# Patient Record
Sex: Male | Born: 1965 | Race: White | Hispanic: No | Marital: Single | Smoking: Never smoker
Health system: Southern US, Community
[De-identification: ages and names within clinical notes are randomized; demographics above are authoritative.]

---

## 2010-04-27 ENCOUNTER — Emergency Department: Payer: Self-pay | Admitting: Emergency Medicine

## 2010-04-29 ENCOUNTER — Ambulatory Visit: Payer: Self-pay | Admitting: Rheumatology

## 2010-11-12 ENCOUNTER — Ambulatory Visit: Payer: Self-pay | Admitting: Rheumatology

## 2011-07-15 ENCOUNTER — Ambulatory Visit: Payer: Self-pay | Admitting: Gastroenterology

## 2012-01-18 ENCOUNTER — Ambulatory Visit: Payer: Self-pay | Admitting: Rheumatology

## 2012-01-31 ENCOUNTER — Emergency Department: Payer: Self-pay | Admitting: Emergency Medicine

## 2012-01-31 ENCOUNTER — Ambulatory Visit: Payer: Self-pay | Admitting: Internal Medicine

## 2012-01-31 LAB — BASIC METABOLIC PANEL
Anion Gap: 8 (ref 7–16)
Chloride: 105 mmol/L (ref 98–107)
Creatinine: 1.16 mg/dL (ref 0.60–1.30)
EGFR (Non-African Amer.): >60
Glucose: 161 mg/dL — ABNORMAL HIGH (ref 65–99)
Osmolality: 281 (ref 275–301)

## 2012-01-31 LAB — CBC
HCT: 46 % (ref 40.0–52.0)
HGB: 15.3 g/dL (ref 13.0–18.0)
RBC: 5.15 10*6/uL (ref 4.40–5.90)
RDW: 14 % (ref 11.5–14.5)
WBC: 9.2 10*3/uL (ref 3.8–10.6)

## 2012-01-31 LAB — URINALYSIS, COMPLETE
Leukocyte Esterase: NEGATIVE
Ph: 5 (ref 4.5–8.0)
Protein: 30
RBC,UR: 1138 /HPF (ref 0–5)
Squamous Epithelial: <1
WBC UR: 8 /HPF (ref 0–5)

## 2012-02-26 ENCOUNTER — Ambulatory Visit: Payer: Self-pay | Admitting: Urology

## 2012-03-23 ENCOUNTER — Ambulatory Visit: Payer: Self-pay | Admitting: Urology

## 2014-09-27 ENCOUNTER — Encounter: Payer: Self-pay | Admitting: Podiatry

## 2014-09-27 ENCOUNTER — Ambulatory Visit (INDEPENDENT_AMBULATORY_CARE_PROVIDER_SITE_OTHER): Payer: No Typology Code available for payment source

## 2014-09-27 ENCOUNTER — Ambulatory Visit (INDEPENDENT_AMBULATORY_CARE_PROVIDER_SITE_OTHER): Payer: No Typology Code available for payment source | Admitting: Podiatry

## 2014-09-27 VITALS — BP 135/82 | HR 90 | Resp 18

## 2014-09-27 DIAGNOSIS — M79609 Pain in unspecified limb: Secondary | ICD-10-CM | POA: Insufficient documentation

## 2014-09-27 DIAGNOSIS — M779 Enthesopathy, unspecified: Secondary | ICD-10-CM | POA: Diagnosis not present

## 2014-09-27 DIAGNOSIS — M79673 Pain in unspecified foot: Secondary | ICD-10-CM

## 2014-09-27 NOTE — Progress Notes (Signed)
Subjective:     Patient ID: Joel Fletcher, male   DOB: March 26, 1965, 49 y.o.   MRN: [id number]  HPI 49 year old male presents to the office today for concerns of tendonitis which has been ongoing for greater than 1 year. He was seeing another podiatrist who treatment him for quite some time and continued to have a lot of foot pain. He was then referred to rheumatology.  He followed up with Dr. Gavin Potters and was diagnosed with psoriatic arthritis. He was on Embrel which helped then changed to [facility] then now on Remicade. Since starting the medication he can walk much better but he states he continues to get pain in his tendons. He went to see Dr. Orland Jarred to get orthotics and was diagnosed with tendonitis.  He has not yet picked up the orthotics.He has tried new shoes and OTC inserts which helps some. 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   Review of Systems  All other systems reviewed and are negative.      Objective:   Physical Exam AAO x3, NAD DP/PT pulses palpable bilaterally, CRT less than 3 seconds Protective sensation intact with Simms Weinstein monofilament, vibratory sensation intact, Achilles tendon reflex intact  There is tenderness palpation  On the proximal portion of the Achilles tendon bilaterally. There is noted palpable defect and Thompson test is negative.  There is no tenderness on the distal portion of the tendon on the insertion in the calcaneus. There is mild equinus present.There is no other areas of tenderness to bilateral lower extremities. No areas of tenderness to bilateral lower extremities. MMT 5/5, ROM WNL.  No open lesions or pre-ulcerative lesions.  No overlying edema, erythema, increase in warmth to bilateral lower extremities.  No pain with calf compression, swelling, warmth, erythema bilaterally.      Assessment:     49 year old male with bilateral Achilles tendinitis     Plan:     -X-rays were obtained and reviewed with the patient.  -Treatment options discussed including all alternatives, risks, and complications -  I discussed likely etiology of his symptoms. I discussed with him that is likely a combination of the psoriatic arthritis causing a tendinitis. I do that he'll likely benefit from physical therapy to help stretch and strengthen the Achilles tendon and his leg muscles however he states that he cannot afford to go. I discussed with him home exercises and rehabilitation exercises to start. He states that he also purchased a night splint (he would like to get one OTC instead of [place] providing one due to cost).  Discussed stretching and icing activities daily. Also discussed and continue supportive shoe gear. He may benefit from a custom orthotics which were previously made. He did so go pick those up. -Follow-up 4 weeks or sooner if any problems arise. In the meantime, encouraged to call the office with any questions, concerns, change in symptoms.   Ovid Curd, DPM

## 2014-09-27 NOTE — Patient Instructions (Signed)
Achilles Tendinitis   with Rehab  Achilles tendinitis is a disorder of the Achilles tendon. The Achilles tendon connects the large calf muscles (Gastrocnemius and Soleus) to the heel bone (calcaneus). This tendon is sometimes called the heel cord. It is important for pushing-off and standing on your toes and is important for walking, running, or jumping. Tendinitis is often caused by overuse and repetitive microtrauma.  SYMPTOMS  · Pain, tenderness, swelling, warmth, and redness may occur over the Achilles tendon even at rest.  · Pain with pushing off, or flexing or extending the ankle.  · Pain that is worsened after or during activity.  CAUSES   · Overuse sometimes seen with rapid increase in exercise programs or in sports requiring running and jumping.  · Poor physical conditioning (strength and flexibility or endurance).  · Running sports, especially training running down hills.  · Inadequate warm-up before practice or play or failure to stretch before participation.  · Injury to the tendon.  PREVENTION   · Warm up and stretch before practice or competition.  · Allow time for adequate rest and recovery between practices and competition.  · Keep up conditioning.  ¨ Keep up ankle and leg flexibility.  ¨ Improve or keep muscle strength and endurance.  ¨ Improve cardiovascular fitness.  · Use proper technique.  · Use proper equipment (shoes, skates).  · To help prevent recurrence, taping, protective strapping, or an adhesive bandage may be recommended for several weeks after healing is complete.  PROGNOSIS   · Recovery may take weeks to several months to heal.  · Longer recovery is expected if symptoms have been prolonged.  · Recovery is usually quicker if the inflammation is due to a direct blow as compared with overuse or sudden strain.  RELATED COMPLICATIONS   · Healing time will be prolonged if the condition is not correctly treated. The injury must be given plenty of time to heal.  · Symptoms can reoccur if  activity is resumed too soon.  · Untreated, tendinitis may increase the risk of tendon rupture requiring additional time for recovery and possibly surgery.  TREATMENT   · The first treatment consists of rest anti-inflammatory medication, and ice to relieve the pain.  · Stretching and strengthening exercises after resolution of pain will likely help reduce the risk of recurrence. Referral to a physical therapist or athletic trainer for further evaluation and treatment may be helpful.  · A walking boot or cast may be recommended to rest the Achilles tendon. This can help break the cycle of inflammation and microtrauma.  · Arch supports (orthotics) may be prescribed or recommended by your caregiver as an adjunct to therapy and rest.  · Surgery to remove the inflamed tendon lining or degenerated tendon tissue is rarely necessary and has shown less than predictable results.  MEDICATION   · Nonsteroidal anti-inflammatory medications, such as aspirin and ibuprofen, may be used for pain and inflammation relief. Do not take within 7 days before surgery. Take these as directed by your caregiver. Contact your caregiver immediately if any bleeding, stomach upset, or signs of allergic reaction occur. Other minor pain relievers, such as acetaminophen, may also be used.  · Pain relievers may be prescribed as necessary by your caregiver. Do not take prescription pain medication for longer than 4 to 7 days. Use only as directed and only as much as you need.  · Cortisone injections are rarely indicated. Cortisone injections may weaken tendons and predispose to rupture. It is better   to give the condition more time to heal than to use them.  HEAT AND COLD  · Cold is used to relieve pain and reduce inflammation for acute and chronic Achilles tendinitis. Cold should be applied for 10 to 15 minutes every 2 to 3 hours for inflammation and pain and immediately after any activity that aggravates your symptoms. Use ice packs or an ice  massage.  · Heat may be used before performing stretching and strengthening activities prescribed by your caregiver. Use a heat pack or a warm soak.  SEEK MEDICAL CARE IF:  · Symptoms get worse or do not improve in 2 weeks despite treatment.  · New, unexplained symptoms develop. Drugs used in treatment may produce side effects.  EXERCISES  RANGE OF MOTION (ROM) AND STRETCHING EXERCISES - Achilles Tendinitis   These exercises may help you when beginning to rehabilitate your injury. Your symptoms may resolve with or without further involvement from your physician, physical therapist or athletic trainer. While completing these exercises, remember:   · Restoring tissue flexibility helps normal motion to return to the joints. This allows healthier, less painful movement and activity.  · An effective stretch should be held for at least 30 seconds.  · A stretch should never be painful. You should only feel a gentle lengthening or release in the stretched tissue.  STRETCH - Gastroc, Standing   · Place hands on wall.  · Extend right / left leg, keeping the front knee somewhat bent.  · Slightly point your toes inward on your back foot.  · Keeping your right / left heel on the floor and your knee straight, shift your weight toward the wall, not allowing your back to arch.  · You should feel a gentle stretch in the right / left calf. Hold this position for __________ seconds.  Repeat __________ times. Complete this stretch __________ times per day.  STRETCH - Soleus, Standing   · Place hands on wall.  · Extend right / left leg, keeping the other knee somewhat bent.  · Slightly point your toes inward on your back foot.  · Keep your right / left heel on the floor, bend your back knee, and slightly shift your weight over the back leg so that you feel a gentle stretch deep in your back calf.  · Hold this position for __________ seconds.  Repeat __________ times. Complete this stretch __________ times per day.  STRETCH -  Gastrocsoleus, Standing   Note: This exercise can place a lot of stress on your foot and ankle. Please complete this exercise only if specifically instructed by your caregiver.   · Place the ball of your right / left foot on a step, keeping your other foot firmly on the same step.  · Hold on to the wall or a rail for balance.  · Slowly lift your other foot, allowing your body weight to press your heel down over the edge of the step.  · You should feel a stretch in your right / left calf.  · Hold this position for __________ seconds.  · Repeat this exercise with a slight bend in your knee.  Repeat __________ times. Complete this stretch __________ times per day.   STRENGTHENING EXERCISES - Achilles Tendinitis  These exercises may help you when beginning to rehabilitate your injury. They may resolve your symptoms with or without further involvement from your physician, physical therapist or athletic trainer. While completing these exercises, remember:   · Muscles can gain both the endurance   and the strength needed for everyday activities through controlled exercises.  · Complete these exercises as instructed by your physician, physical therapist or athletic trainer. Progress the resistance and repetitions only as guided.  · You may experience muscle soreness or fatigue, but the pain or discomfort you are trying to eliminate should never worsen during these exercises. If this pain does worsen, stop and make certain you are following the directions exactly. If the pain is still present after adjustments, discontinue the exercise until you can discuss the trouble with your clinician.  STRENGTH - Plantar-flexors   · Sit with your right / left leg extended. Holding onto both ends of a rubber exercise band/tubing, loop it around the ball of your foot. Keep a slight tension in the band.  · Slowly push your toes away from you, pointing them downward.  · Hold this position for __________ seconds. Return slowly, controlling the  tension in the band/tubing.  Repeat __________ times. Complete this exercise __________ times per day.   STRENGTH - Plantar-flexors   · Stand with your feet shoulder width apart. Steady yourself with a wall or table using as little support as needed.  · Keeping your weight evenly spread over the width of your feet, rise up on your toes.*  · Hold this position for __________ seconds.  Repeat __________ times. Complete this exercise __________ times per day.   *If this is too easy, shift your weight toward your right / left leg until you feel challenged. Ultimately, you may be asked to do this exercise with your right / left foot only.  STRENGTH - Plantar-flexors, Eccentric   Note: This exercise can place a lot of stress on your foot and ankle. Please complete this exercise only if specifically instructed by your caregiver.   · Place the balls of your feet on a step. With your hands, use only enough support from a wall or rail to keep your balance.  · Keep your knees straight and rise up on your toes.  · Slowly shift your weight entirely to your right / left toes and pick up your opposite foot. Gently and with controlled movement, lower your weight through your right / left foot so that your heel drops below the level of the step. You will feel a slight stretch in the back of your calf at the end position.  · Use the healthy leg to help rise up onto the balls of both feet, then lower weight only on the right / left leg again. Build up to 15 repetitions. Then progress to 3 consecutive sets of 15 repetitions.*  · After completing the above exercise, complete the same exercise with a slight knee bend (about 30 degrees). Again, build up to 15 repetitions. Then progress to 3 consecutive sets of 15 repetitions.*  Perform this exercise __________ times per day.   *When you easily complete 3 sets of 15, your physician, physical therapist or athletic trainer may advise you to add resistance by wearing a backpack filled with  additional weight.  STRENGTH - Plantar Flexors, Seated   · Sit on a chair that allows your feet to rest flat on the ground. If necessary, sit at the edge of the chair.  · Keeping your toes firmly on the ground, lift your right / left heel as far as you can without increasing any discomfort in your ankle.  Repeat __________ times. Complete this exercise __________ times a day.  *If instructed by your physician, physical therapist or athletic   trainer, you may add ____________________ of resistance by placing a weighted object on your right / left knee.  Document Released: 08/20/2004 Document Revised: 04/13/2011 Document Reviewed: 05/03/2008  [employer]® Patient Information ©2015 [employer]. This information is not intended to replace advice given to you by your health care provider. Make sure you discuss any questions you have with your health care provider.

## 2014-10-24 ENCOUNTER — Other Ambulatory Visit: Payer: Self-pay | Admitting: Gastroenterology

## 2014-10-24 DIAGNOSIS — R7989 Other specified abnormal findings of blood chemistry: Secondary | ICD-10-CM

## 2014-10-24 DIAGNOSIS — R945 Abnormal results of liver function studies: Secondary | ICD-10-CM

## 2014-10-25 ENCOUNTER — Ambulatory Visit: Payer: No Typology Code available for payment source | Admitting: Podiatry

## 2014-10-29 ENCOUNTER — Ambulatory Visit: Payer: Self-pay

## 2014-11-06 ENCOUNTER — Ambulatory Visit
Admission: RE | Admit: 2014-11-06 | Discharge: 2014-11-06 | Disposition: A | Discharge status: Home / Self Care | Payer: No Typology Code available for payment source | Source: Ambulatory Visit | Attending: Gastroenterology | Admitting: Gastroenterology

## 2014-11-06 DIAGNOSIS — Z87442 Personal history of urinary calculi: Secondary | ICD-10-CM | POA: Diagnosis not present

## 2014-11-06 DIAGNOSIS — N200 Calculus of kidney: Secondary | ICD-10-CM | POA: Diagnosis not present

## 2014-11-06 DIAGNOSIS — R7989 Other specified abnormal findings of blood chemistry: Secondary | ICD-10-CM | POA: Diagnosis present

## 2014-11-06 DIAGNOSIS — R945 Abnormal results of liver function studies: Secondary | ICD-10-CM

## 2015-07-08 ENCOUNTER — Other Ambulatory Visit: Payer: Self-pay | Admitting: Obstetrics and Gynecology

## 2015-07-08 ENCOUNTER — Ambulatory Visit
Admission: RE | Admit: 2015-07-08 | Discharge: 2015-07-08 | Disposition: A | Discharge status: Home / Self Care | Payer: Disability Insurance | Source: Ambulatory Visit | Attending: Obstetrics and Gynecology | Admitting: Obstetrics and Gynecology

## 2015-07-08 DIAGNOSIS — L4050 Arthropathic psoriasis, unspecified: Secondary | ICD-10-CM

## 2015-07-08 DIAGNOSIS — M4602 Spinal enthesopathy, cervical region: Secondary | ICD-10-CM | POA: Insufficient documentation

## 2015-07-08 DIAGNOSIS — M2578 Osteophyte, vertebrae: Secondary | ICD-10-CM | POA: Diagnosis not present

## 2017-05-09 IMAGING — US US ABDOMEN COMPLETE
1 series · 13 of 25 positions shown · non-contrast
Comparison: Abdominal CT scan March 23, 2012 and abdominal
ultrasound July 15, 2011.

CLINICAL DATA: Abnormal liver function studies.

EXAM:
ULTRASOUND ABDOMEN COMPLETE

[Series 1: us abdomen complete · 0.22mm/px · 13 of 84 slices shown]
[im 1/84]
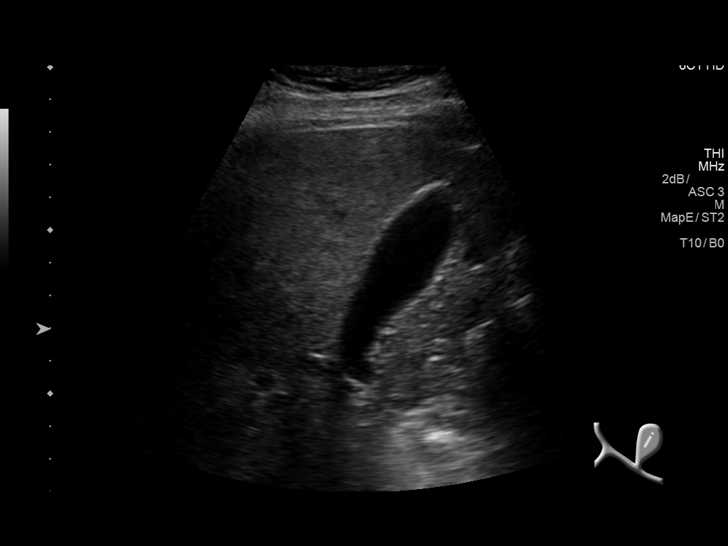
[im 7/84]
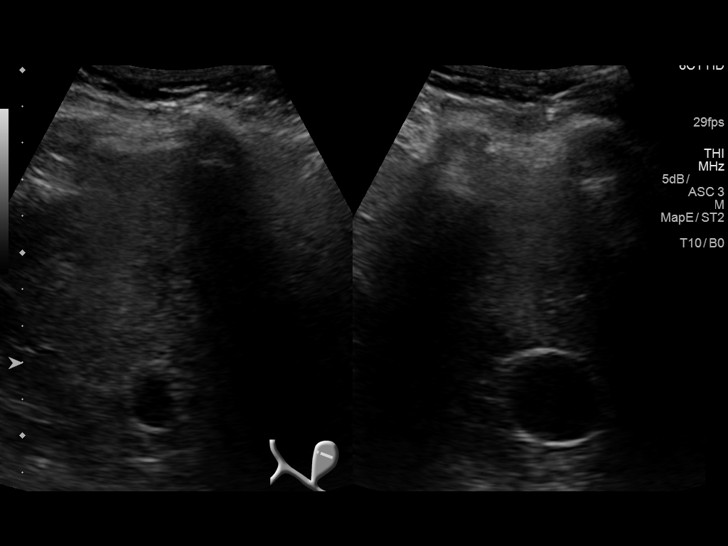
[im 14/84]
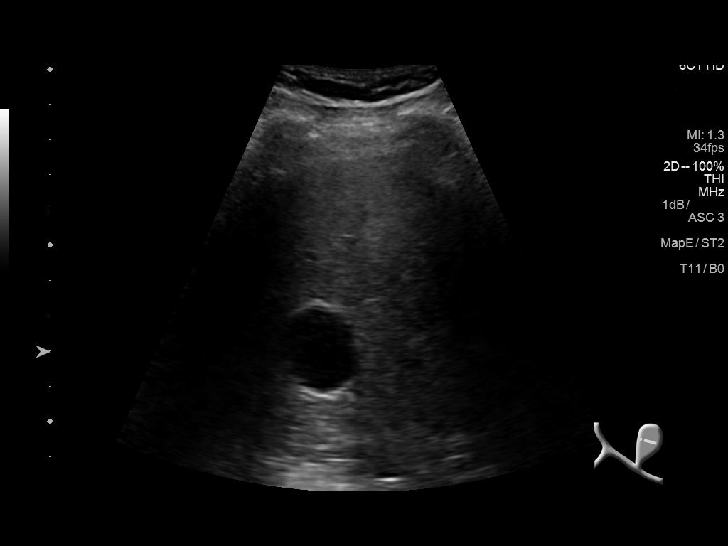
[im 21/84]
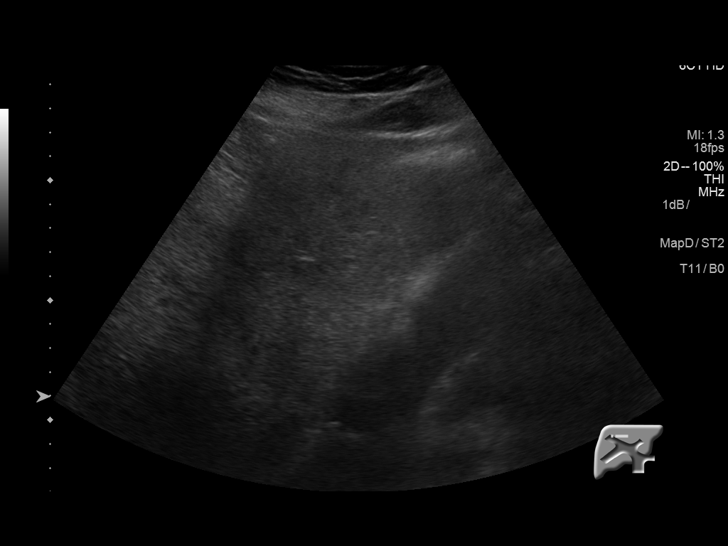
[im 28/84]
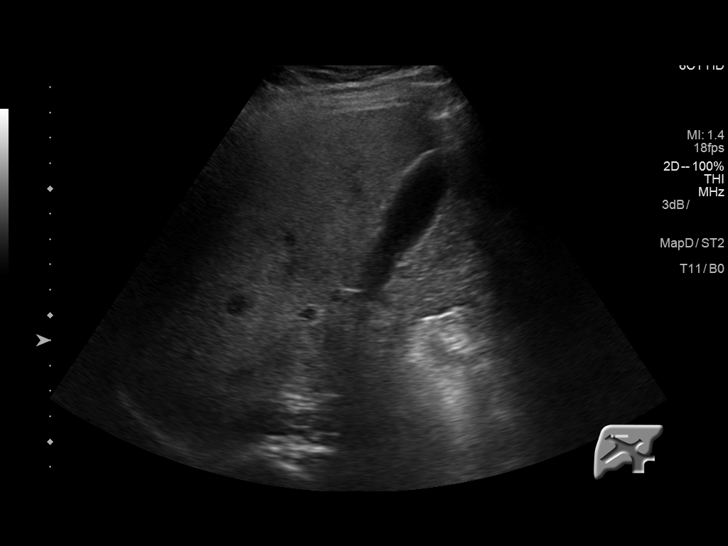
[im 35/84]
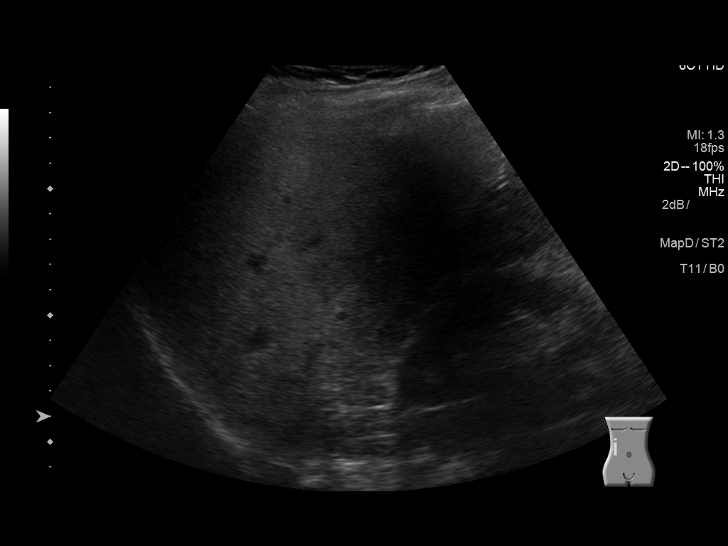
[im 42/84]
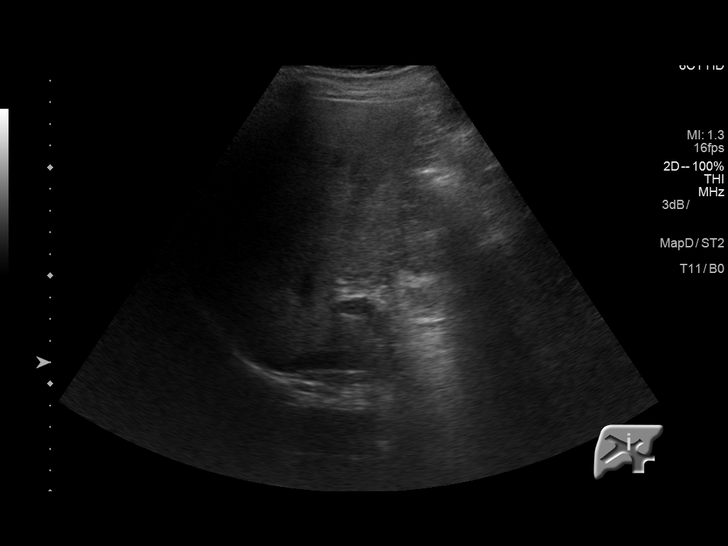
[im 49/84]
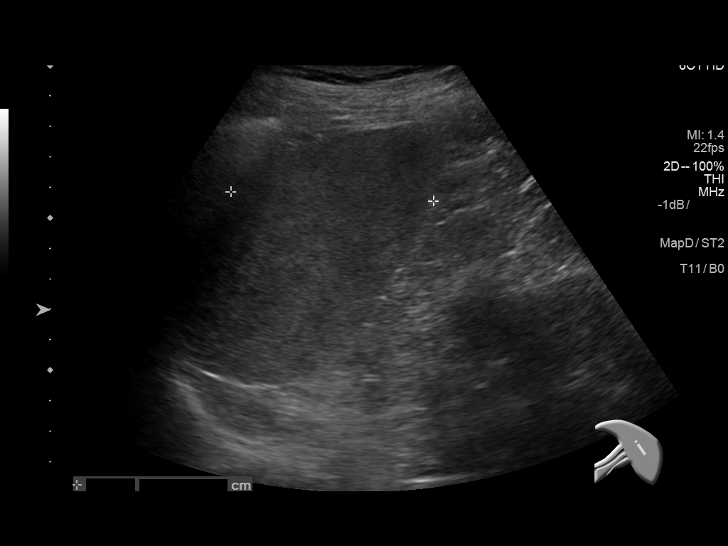
[im 56/84]
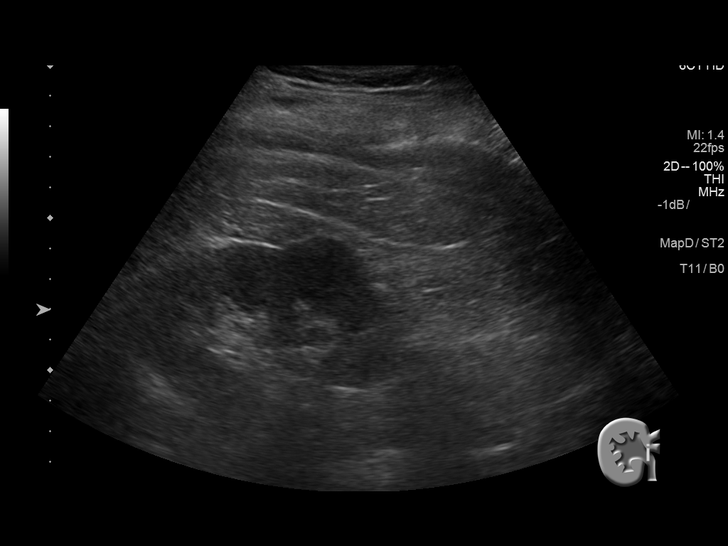
[im 63/84]
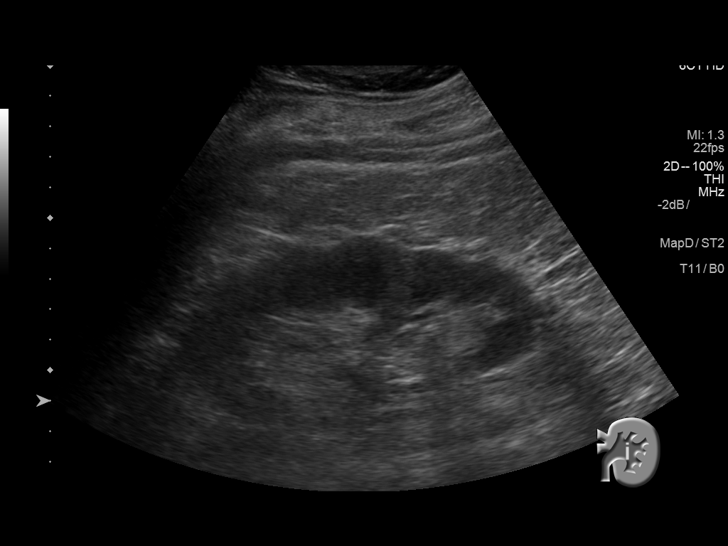
[im 70/84]
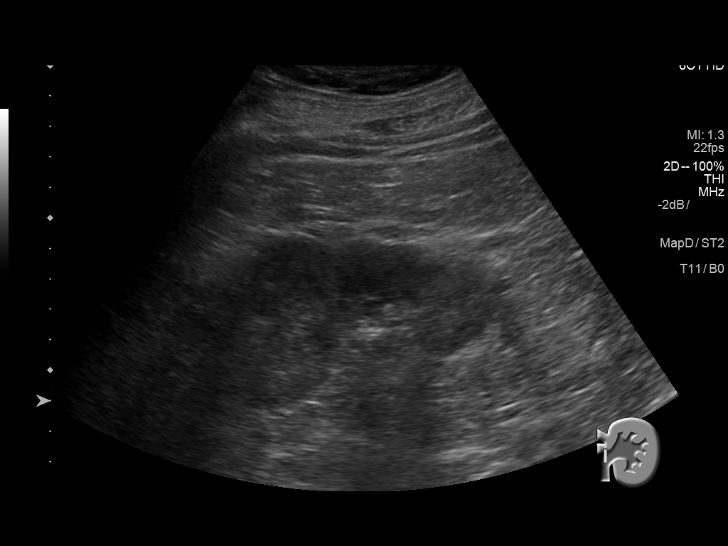
[im 77/84]
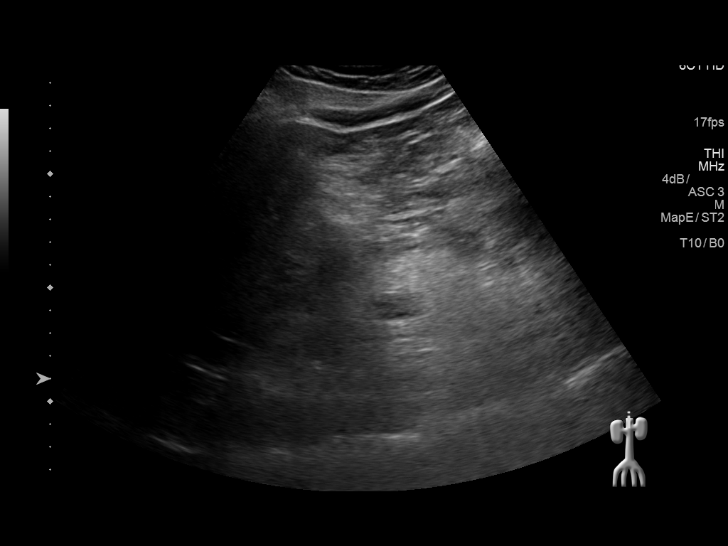
[im 84/84]
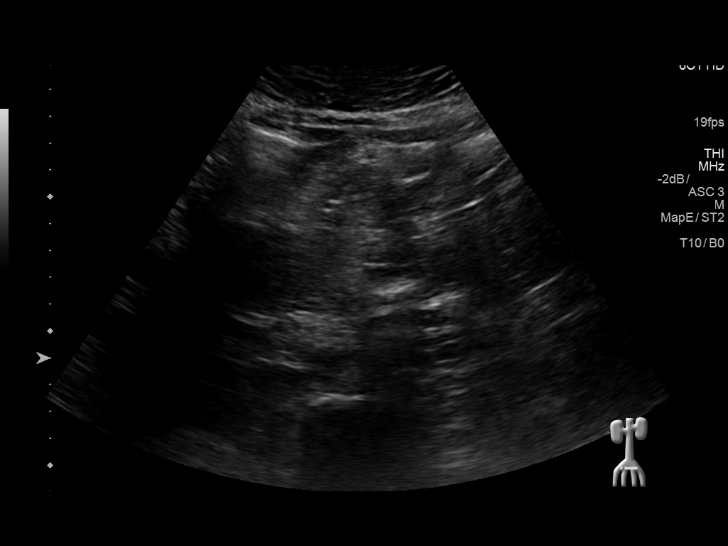

[13 of 25 positions shown; findings below may reference images not displayed]

FINDINGS: Gallbladder: No gallstones or wall thickening visualized. No
sonographic Murphy sign noted.

Common bile duct: Diameter: 3.4 mm

Liver: The hepatic parenchyma somewhat difficult to penetrate with
the ultrasound beam. The echotexture is mildly increased. Areas of
focal fatty sparing are present.

IVC: No abnormality visualized.

Pancreas: Bowel gas largely obscures the pancreatic body and tail.

Spleen: Size and appearance within normal limits.

Right Kidney: Length: 11.2 cm. Echogenicity within normal limits. No
mass or hydronephrosis visualized.

Left Kidney: Length: 11.9 cm. Echogenicity within normal limits. No
mass or hydronephrosis visualized. Known stones in the left kidney
are demonstrated. These lie in the mid to lower pole.

Abdominal aorta: No aneurysm visualized.

Other findings: There is no ascites.
IMPRESSION: 1. The hepatic echotexture is heterogeneous and mildly increased
that may reflect fatty infiltrative change with areas of focal fatty
sparing. However, further evaluation of the liver with hepatic
protocol MRI is recommended.
2. Evaluation the pancreas is limited.
3. Two nonobstructing stones in the left kidney. Patient has known
history of kidney stones.

## 2017-12-03 IMAGING — CR DG CERVICAL SPINE 2 OR 3 VIEWS
1 series · 4 of 4 positions shown · non-contrast
Comparison: None in PACs

CLINICAL DATA: Disability determination, history of neck arthritis,
possibly psoriatic.

EXAM:
CERVICAL SPINE - 2-3 VIEW

[Series 1: dg cervical spine 2 or 3 views · 0.14mm/px · 4 of 4 slices shown]
[im 1/4]
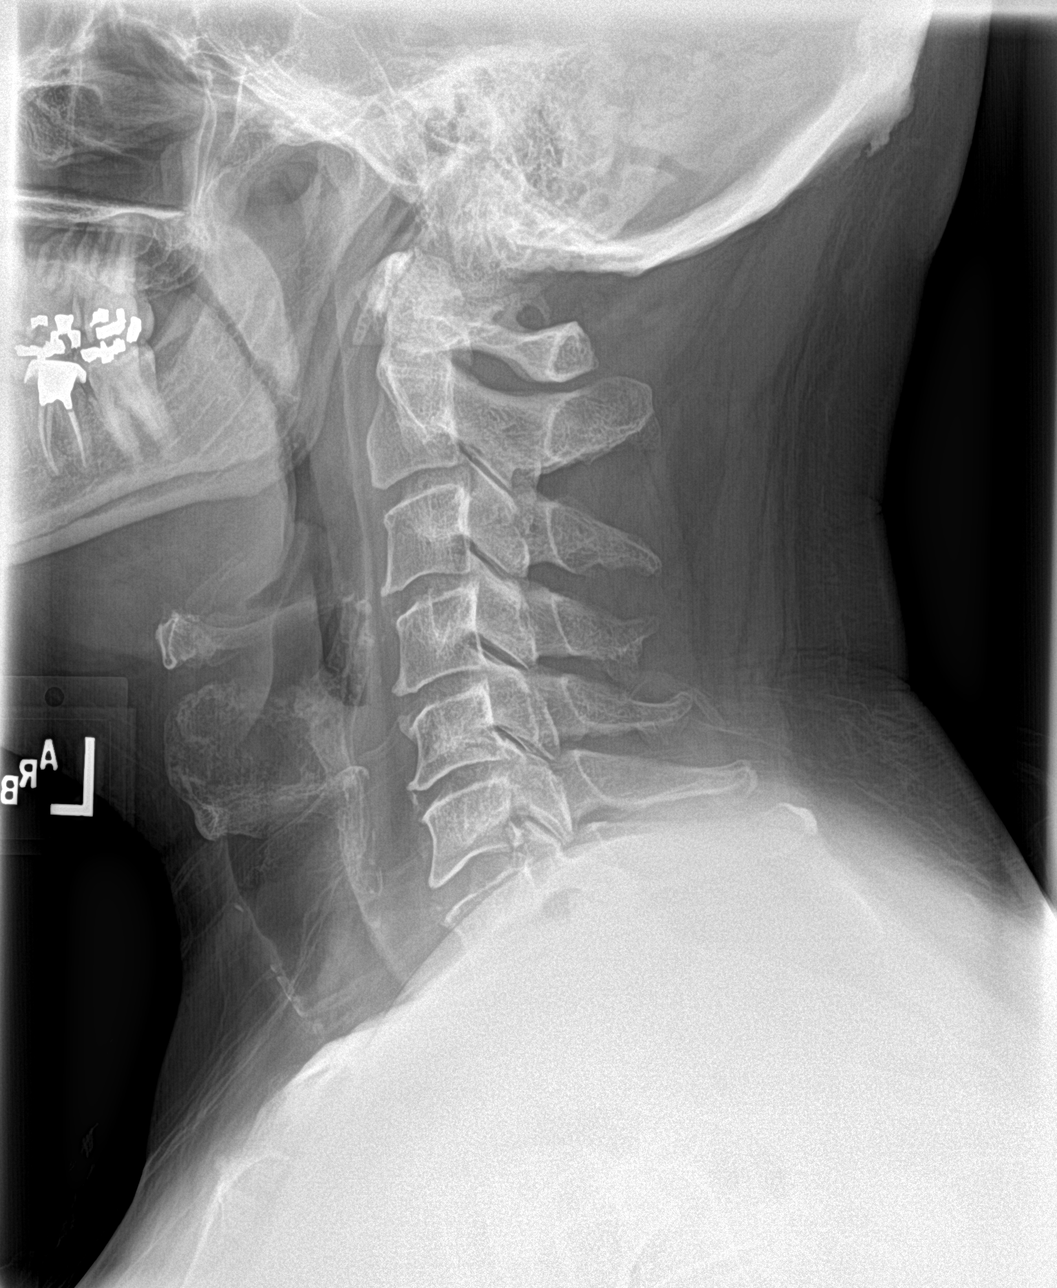
[im 2/4]
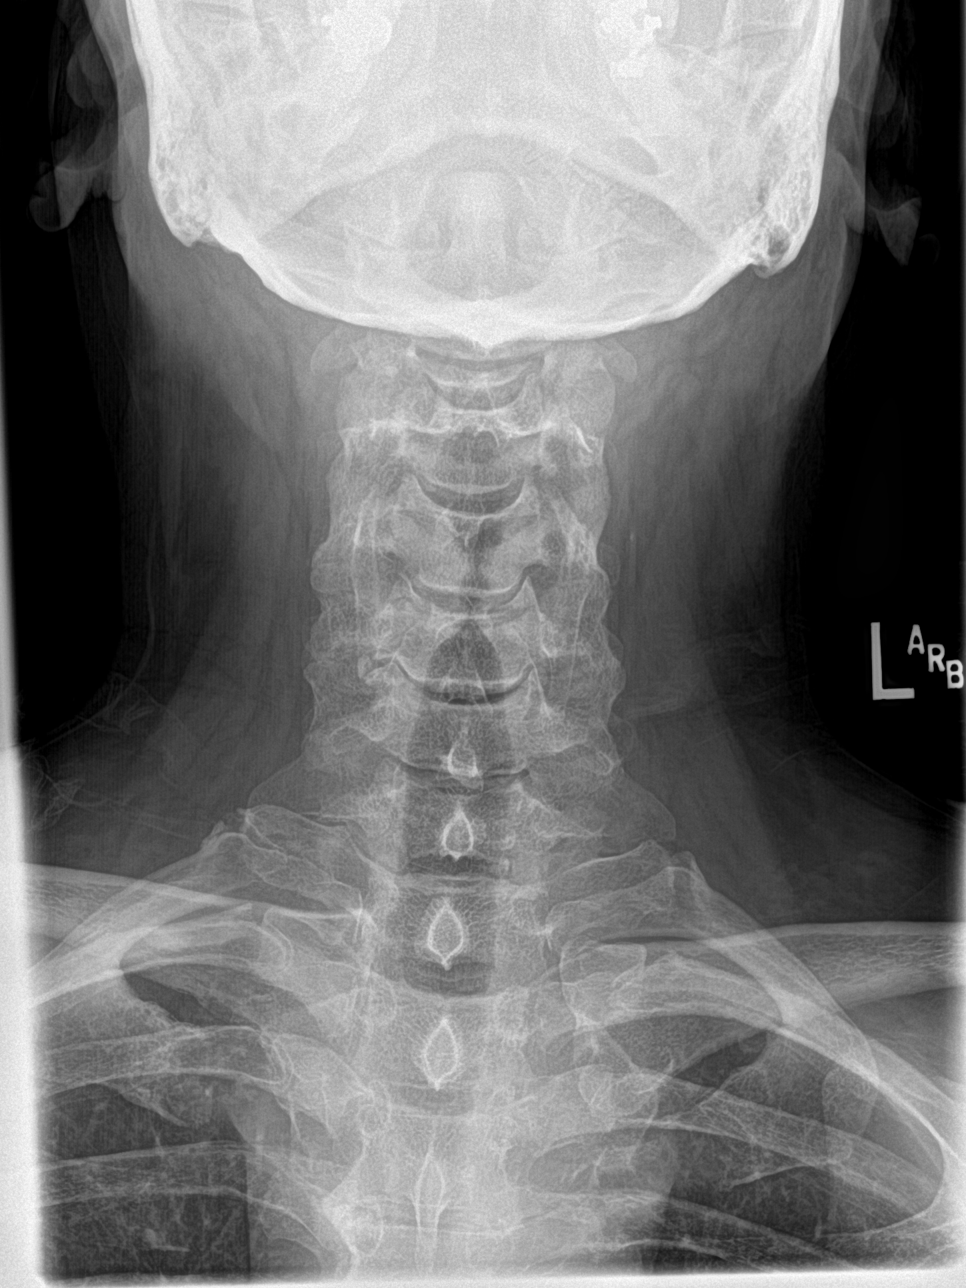
[im 3/4]
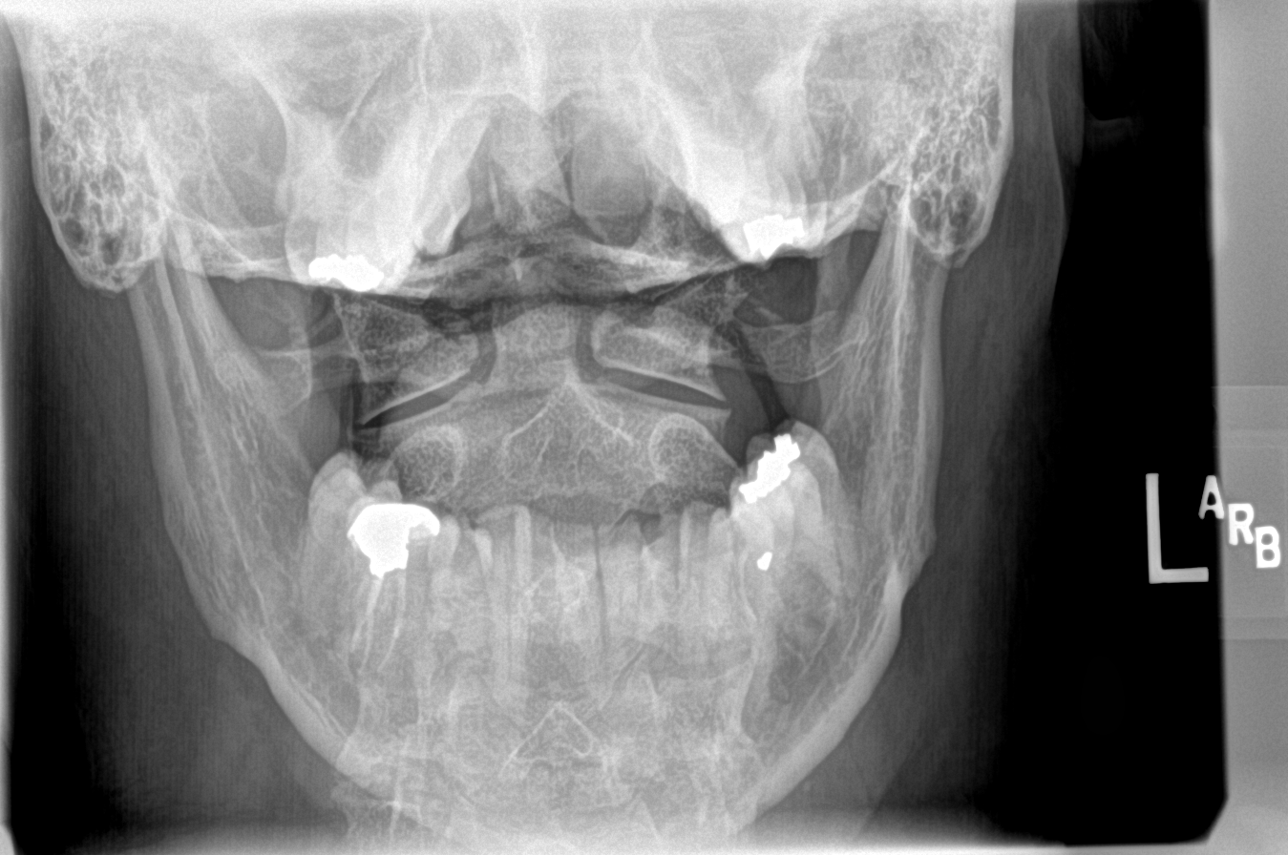
[im 4/4]
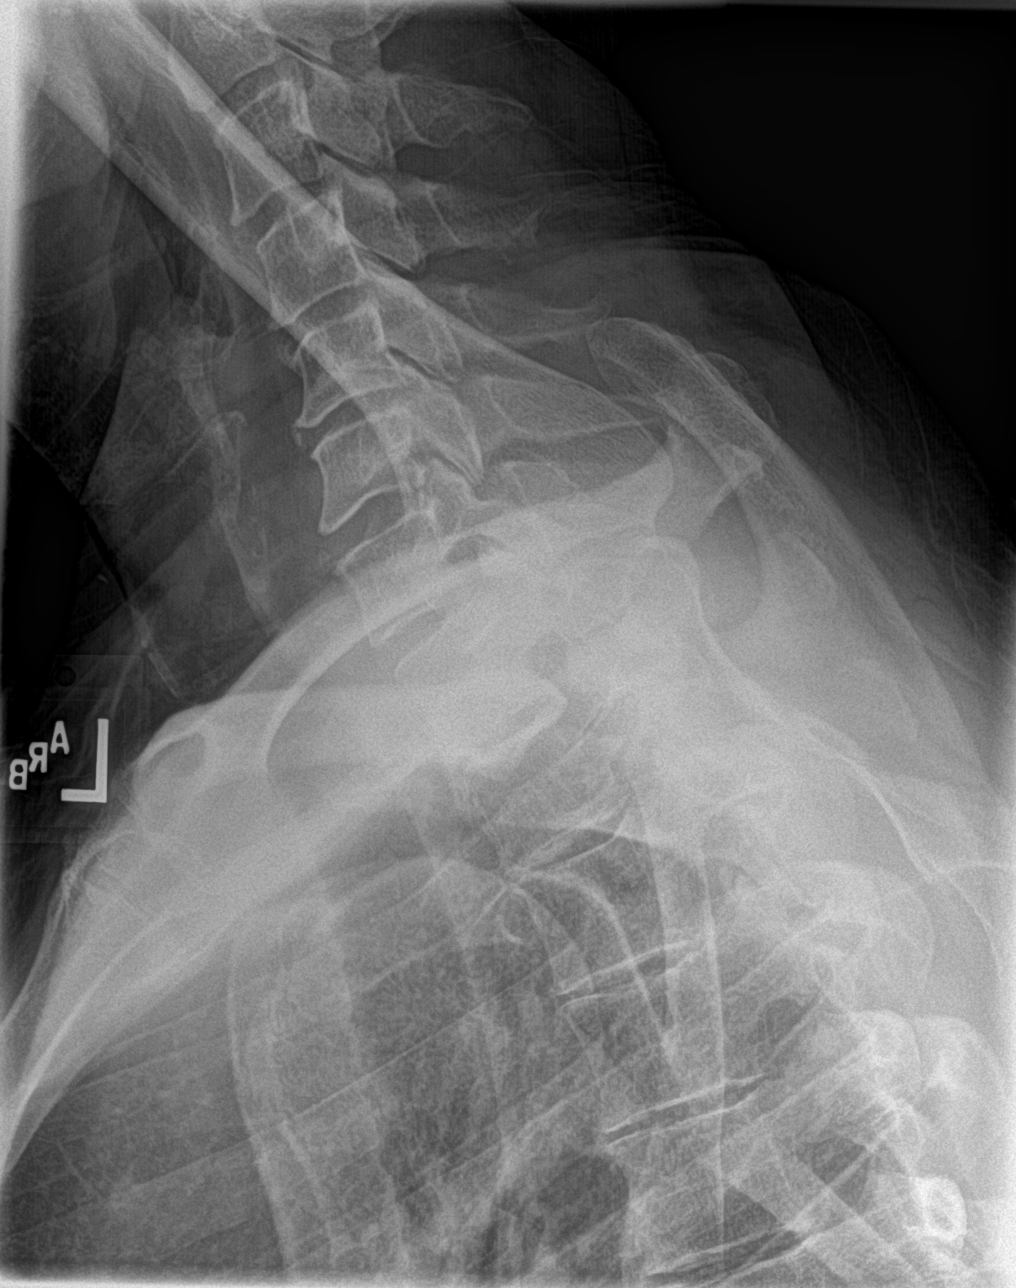

[4 of 4 positions shown; findings below may reference images not displayed]

FINDINGS: The cervical vertebral bodies are preserved in height with exception
of C5-6 where there is minimal narrowing. The disc space heights are
well maintained. There are small anterior endplate osteophytes from
C3 through C7. There is no perched facet. The spinous processes are
intact. The odontoid and prevertebral soft tissue spaces appear
normal.
IMPRESSION: Minimal degenerative spurring from C3 through C7. Minimal disc space
narrowing at C5-6. No erosions are observed.
